# Patient Record
Sex: Female | Born: 1970 | Race: Black or African American | Hispanic: No | Marital: Single | Smoking: Never smoker
Health system: Southern US, Community
[De-identification: ages and names within clinical notes are randomized; demographics above are authoritative.]

---

## 2000-01-26 ENCOUNTER — Encounter: Payer: Self-pay | Admitting: *Deleted

## 2000-01-26 ENCOUNTER — Encounter (INDEPENDENT_AMBULATORY_CARE_PROVIDER_SITE_OTHER): Payer: Self-pay

## 2000-01-26 ENCOUNTER — Encounter (HOSPITAL_COMMUNITY): Admission: RE | Admit: 2000-01-26 | Discharge: 2000-04-20 | Payer: Self-pay | Admitting: *Deleted

## 2000-02-03 ENCOUNTER — Encounter: Admission: RE | Admit: 2000-02-03 | Discharge: 2000-02-03 | Payer: Self-pay | Admitting: Obstetrics & Gynecology

## 2000-02-11 ENCOUNTER — Encounter: Admission: RE | Admit: 2000-02-11 | Discharge: 2000-02-11 | Payer: Self-pay | Admitting: Obstetrics

## 2000-02-25 ENCOUNTER — Encounter: Admission: RE | Admit: 2000-02-25 | Discharge: 2000-02-25 | Payer: Self-pay | Admitting: Obstetrics

## 2000-03-24 ENCOUNTER — Encounter: Admission: RE | Admit: 2000-03-24 | Discharge: 2000-03-24 | Payer: Self-pay | Admitting: Obstetrics

## 2000-03-28 ENCOUNTER — Inpatient Hospital Stay (HOSPITAL_COMMUNITY): Admission: RE | Admit: 2000-03-28 | Discharge: 2000-03-28 | Payer: Self-pay | Admitting: Obstetrics

## 2000-03-31 ENCOUNTER — Encounter: Admission: RE | Admit: 2000-03-31 | Discharge: 2000-03-31 | Payer: Self-pay | Admitting: Obstetrics

## 2000-04-07 ENCOUNTER — Encounter: Admission: RE | Admit: 2000-04-07 | Discharge: 2000-04-07 | Payer: Self-pay | Admitting: Obstetrics

## 2000-04-14 ENCOUNTER — Encounter: Admission: RE | Admit: 2000-04-14 | Discharge: 2000-04-14 | Payer: Self-pay | Admitting: Obstetrics

## 2000-04-18 ENCOUNTER — Inpatient Hospital Stay (HOSPITAL_COMMUNITY): Admission: AD | Admit: 2000-04-18 | Discharge: 2000-04-22 | Payer: Self-pay | Admitting: Obstetrics & Gynecology

## 2001-09-28 ENCOUNTER — Other Ambulatory Visit: Admission: RE | Admit: 2001-09-28 | Discharge: 2001-09-28 | Payer: Self-pay | Admitting: Obstetrics

## 2005-08-26 ENCOUNTER — Ambulatory Visit: Payer: Self-pay | Admitting: Neonatology

## 2005-08-26 ENCOUNTER — Inpatient Hospital Stay (HOSPITAL_COMMUNITY): Admission: AD | Admit: 2005-08-26 | Discharge: 2005-08-31 | Payer: Self-pay | Admitting: Obstetrics

## 2005-10-06 ENCOUNTER — Inpatient Hospital Stay (HOSPITAL_COMMUNITY): Admission: AD | Admit: 2005-10-06 | Discharge: 2005-10-06 | Payer: Self-pay | Admitting: Obstetrics

## 2005-10-20 ENCOUNTER — Inpatient Hospital Stay (HOSPITAL_COMMUNITY): Admission: RE | Admit: 2005-10-20 | Discharge: 2005-10-22 | Payer: Self-pay | Admitting: Obstetrics

## 2005-10-20 ENCOUNTER — Ambulatory Visit: Payer: Self-pay | Admitting: Infectious Diseases

## 2005-12-10 ENCOUNTER — Ambulatory Visit (HOSPITAL_COMMUNITY): Admission: RE | Admit: 2005-12-10 | Discharge: 2005-12-10 | Payer: Self-pay | Admitting: Obstetrics & Gynecology

## 2005-12-10 ENCOUNTER — Encounter (INDEPENDENT_AMBULATORY_CARE_PROVIDER_SITE_OTHER): Payer: Self-pay | Admitting: *Deleted

## 2005-12-10 ENCOUNTER — Encounter (INDEPENDENT_AMBULATORY_CARE_PROVIDER_SITE_OTHER): Payer: Self-pay | Admitting: Specialist

## 2005-12-12 ENCOUNTER — Inpatient Hospital Stay (HOSPITAL_COMMUNITY): Admission: AD | Admit: 2005-12-12 | Discharge: 2005-12-12 | Payer: Self-pay | Admitting: Obstetrics

## 2005-12-19 ENCOUNTER — Emergency Department (HOSPITAL_COMMUNITY): Admission: EM | Admit: 2005-12-19 | Discharge: 2005-12-19 | Payer: Self-pay | Admitting: Emergency Medicine

## 2007-12-16 ENCOUNTER — Emergency Department (HOSPITAL_COMMUNITY): Admission: EM | Admit: 2007-12-16 | Discharge: 2007-12-17 | Payer: Self-pay | Admitting: Emergency Medicine

## 2010-04-05 ENCOUNTER — Encounter: Payer: Self-pay | Admitting: Obstetrics

## 2010-07-31 NOTE — Discharge Summary (Signed)
Teresa Bernard, FISKE            ACCOUNT NO.:  [id number]   MEDICAL RECORD NO.:  [id number]          PATIENT TYPE:  INP   LOCATION:  9154                          [facility]   PHYSICIAN:  Roseanna Rainbow, M.D.DATE OF BIRTH:  January 31, 1971   DATE OF ADMISSION:  08/26/2005  DATE OF DISCHARGE:  08/31/2005                                 DISCHARGE SUMMARY   CHIEF COMPLAINT:  The patient is a 40 year old gravida 3, para 1 with an  estimated date of confinement August 10 with an intrauterine pregnancy at  31+ weeks complaining of decreased fetal movement.  Please see the dictated  history and physical for further details.   HOSPITAL COURSE:  The patient was admitted. The fetal heart rate was  monitored continuously.  She was initially given magnesium sulfate for  tocolysis.  She also received a course of steroids to stimulate fetal lung  maturity.  Serial BPPs were reassuring.  She was discharged to home on  June19.  She was counseled to perform b.i.d. kick counts, continue modified  bed rest, and twice weekly testing.   DISCHARGE DIAGNOSIS:  Intrauterine pregnancy at 32 weeks, suspicious fetal  heart tracing.   CONDITION:  Good.   DIET:  Regular.   ACTIVITY:  Modified bed rest   MEDICATIONS:  Resume home medications.   DISPOSITION:  The patient was to follow up at the [facility]  on June22 at 12:30 she was to follow at the [facility] on [address]  at 2:20.      Roseanna Rainbow, M.D.  Electronically Signed     LAJ/MEDQ  D:  09/25/2005  T:  09/25/2005  Job:  [id number]

## 2010-07-31 NOTE — Op Note (Signed)
Teresa Bernard, Teresa Bernard            ACCOUNT NO.:  [id number]   MEDICAL RECORD NO.:  [id number]          PATIENT TYPE:  AMB   LOCATION:  SDC                           [facility]   PHYSICIAN:  Roseanna Rainbow, M.D.DATE OF BIRTH:  November 26, 1970   DATE OF PROCEDURE:  12/10/2005  DATE OF DISCHARGE:  12/10/2005                                 OPERATIVE REPORT   PREOPERATIVE DIAGNOSIS:  Multipara, desires sterilization procedure.   POSTOPERATIVE DIAGNOSIS:  Multipara, desires sterilization procedure.  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   PROCEDURE:  Laparoscopic bilateral tubal ligation with fulguration and left  ovarian cystectomy.   SURGEON:  Dr. Tamela Oddi and Dr. Clearance Coots.   ANESTHESIA:  General endotracheal.   COMPLICATIONS:  None.   ESTIMATED BLOOD LOSS:  Minimal.   PATHOLOGY:  Left ovarian cyst.   PROCEDURE:  The patient was taken to the operating room.  General anesthesia  was administered without difficulty.  She was then placed in the dorsal  lithotomy position and prepped and draped in the usual sterile fashion.  A  bivalve speculum was placed in the patient's vagina.  The anterior lip of  the cervix was grasped with a single-tooth tenaculum.  A Hulka manipulator  was then advanced into the uterus and secured to the anterior lip of the  cervix.  The single-tooth tenaculum was then removed.  Attention was then  turned to the abdomen where an infraumbilical skin incision was made in the  umbilical fold.  The Veress needle was introduced into the abdomen while  tenting up the anterior abdominal wall to 45 degrees angle.  Intra-abdominal  placement was confirmed by a saline drop test as well as an appropriate low  pressure reading upon insufflation of the abdomen with CO2 gas.  The abdomen  was insufflated with 3.5 liters of CO2 gas.  The trocar and sleeve were then  advanced into the abdomen where intra-abdominal placement was confirmed by  the  laparoscope.  Survey of the pelvic anatomy demonstrated both the simple  appearing cyst emanating from the left ovary as well as a pedunculated  dermoid cyst arising from the ovary.  This cystic area was approximately 3  cm in diameter.  At this point a supraumbilical 10 mm trocar and sleeve were  advanced to the abdomen as well as a 5 mm left lower quadrant trocar and  sleeve.  Peritoneal washings were obtained.  The mid isthmic portion of the  fallopian tubes were cauterized using the Kleppinger.  A 2 cm segment of  tube was cauterized contiguously bilaterally.  With each application the  ohmmeter was noted to go to zero.  The stalk of the ovarian cyst on the left  tube was then cauterized with the Kleppinger.  This area was then severed  with Endoshears.  During this process the cyst wall was inadvertently  ruptured and there was some leakage of sebaceous fluid.  The cyst was then  placed in the EndoCatch.  The EndoCatch was then brought to the anterior  abdominal wall and removal of the cyst was facilitated by morcellating the  cyst.  The fascial incisions for the suprapubic and infraumbilical ports  were then reapproximated with 0 Vicryl.  The skin was reapproximated with  interrupted sutures of 3-0 Vicryl and Dermabond.  Please note that the  Nezhat irrigator was used to copiously irrigate the pelvis and abdomen.  The  operative sites were felt to be hemostatic.  The Hulka manipulator was  removed from the cervix with minimal bleeding noted.  At the close of the  procedure the instrument and pack counts were said to be correct x2.  The  patient was awakened from general anesthesia and taken to the PACU in stable  condition.      Roseanna Rainbow, M.D.  Electronically Signed     LAJ/MEDQ  D:  12/10/2005  T:  12/12/2005  Job:  [id number]

## 2010-07-31 NOTE — H&P (Signed)
Teresa Bernard, PAULSON            ACCOUNT NO.:  [id number]   MEDICAL RECORD NO.:  [id number]          PATIENT TYPE:  AMB   LOCATION:  SDC                           [facility]   PHYSICIAN:  Roseanna Rainbow, M.D.DATE OF BIRTH:  01-25-1971   DATE OF ADMISSION:  DATE OF DISCHARGE:                                HISTORY & PHYSICAL   CHIEF COMPLAINT:  The patient is a 40 year old para 2 who desires a  sterilization procedure.   HISTORY OF PRESENT ILLNESS:  Please see the above.   ALLERGIES:  No known drug allergies.   MEDICATIONS:  Kaletra and Combivir.   PAST GYN HISTORY:  She has a history of abnormal Pap smears.  She has a  history of genital herpes.   PAST MEDICAL HISTORY:  She is HIV positive.   PAST SURGICAL HISTORY:  No previous surgery.   PAST OBSTETRICAL HISTORY:  She has a history of two spontaneous vaginal  deliveries.   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  She is single.  She does not give any significant history of alcohol use.  She has no  significant smoking history.  She denies illicit drug use.   FAMILY HISTORY:  Hypertension.   PHYSICAL EXAMINATION:  VITAL SIGNS:  Temperature 98.8, pulse 80, blood pressure 117/89, weight 114  pounds.  GENERAL: Thin woman in no apparent distress.  LUNGS:  Clear to auscultation bilaterally.  HEART:  Regular rate and rhythm.  ABDOMEN: Soft, nontender, no organomegaly.  PELVIC:  Normal EG BUS, speculum exam reveals the vagina is clean.  Bimanual  exam, the uterus is small, anteverted, nontender.  The adnexa are not  palpable and nontender.  EXTREMITIES:  No cyanosis, clubbing, and edema.  SKIN:  Without rash.   ASSESSMENT:  Multiparous who desires a sterilization procedure.   PLAN:  The planned procedure is a laparoscopic bilateral tubal ligation.  The risks, benefits, and alternative forms of management were reviewed with  the patient including but not limited to a 1% failure rate with a  subsequent  increase of an ectopic pregnancy.      Roseanna Rainbow, M.D.  Electronically Signed     LAJ/MEDQ  D:  12/09/2005  T:  12/09/2005  Job:  [id number]

## 2010-07-31 NOTE — H&P (Signed)
Teresa Bernard, Teresa Bernard            ACCOUNT NO.:  [id number]   MEDICAL RECORD NO.:  [id number]          PATIENT TYPE:  INP   LOCATION:  9154                          [facility]   PHYSICIAN:  Roseanna Rainbow, M.D.DATE OF BIRTH:  January 14, 1971   DATE OF ADMISSION:  08/26/2005  DATE OF DISCHARGE:                                HISTORY & PHYSICAL   CHIEF COMPLAINT:  The patient is a 40 year old gravida 3, para 1, with an  estimated date of confinement of October 22, 2005, with an intrauterine  pregnancy at 31 plus weeks complaining of decreased fetal movement.   HISTORY OF PRESENT ILLNESS:  Please see the above.  She denies any other  complaints.   ALLERGIES:  No known drug allergies.   MEDICATIONS:  Valtrex, prenatal vitamins, Kaletra, and Combivir.   RISK FACTORS:  HIV positive, polyhydramnios, advanced maternal age, anemia.   LABORATORY DATA:  Hemoglobin 9.8, hematocrit 30, platelets 254,000.  One  hour GCT 101.  Urine culture and sensitivity with insignificant growth.  Hepatitis B surface antigen negative.  Blood type is B positive, antibody  screen negative.  Rubella is non-immune.  RPR nonreactive.  Sickle cell  negative.  An ultrasound on April 20 at 24 weeks, the estimated fetal weight  percentile was 39th percentile, polyhydramnios was noted with an AFI of  27.2, the fetal stomach was normal.  On May 14, ultrasound was repeated at  27 weeks 3 weeks, the AFI was 21.5, estimated fetal weight percentile was  57th percentile, and this was felt to be high normal amniotic fluid and  anatomy was normal.   PAST GYN HISTORY:  She has a history of abnormal Pap smears.   PAST MEDICAL HISTORY:  Please see the above.   PAST SURGICAL HISTORY:  No previous surgery.   PAST OBSTETRICAL HISTORY:  She has a history of spontaneous vaginal delivery  at 36 weeks.  Per patient report, labor was induced for oligohydramnios.   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  She is  single.  She does  not give any significant history of alcohol use.  She has no significant  smoking history.  She denies illicit drug use.   FAMILY HISTORY:  Hypertension.   PHYSICAL EXAMINATION:  VITAL SIGNS:  Heart rate 78, respiratory rate 18, blood pressure 96/73,  fetal heart tracing baseline 160s to 170s, average long term variability,  occasional variable decelerations a with contraction.  ABDOMEN:  Gravid, nontender.  PELVIC:  Sterile vaginal exam per the RN.   ASSESSMENT:  Intrauterine pregnancy at 31 plus weeks with irregular  contractions, fetal heart tracing with mild sinus tachycardia and mild  variable decelerations.   PLAN:  Admission, magnesium sulfate tocolysis, steroids to stimulate fetal  lung maturity, monitor the fetal heart tracing closely, and will also obtain  a biophysical profile.      Roseanna Rainbow, M.D.  Electronically Signed     LAJ/MEDQ  D:  08/26/2005  T:  08/26/2005  Job:  [id number]

## 2011-01-14 ENCOUNTER — Other Ambulatory Visit (HOSPITAL_COMMUNITY): Payer: Self-pay | Admitting: *Deleted

## 2011-01-14 ENCOUNTER — Other Ambulatory Visit: Payer: Self-pay

## 2011-01-14 DIAGNOSIS — Z1231 Encounter for screening mammogram for malignant neoplasm of breast: Secondary | ICD-10-CM

## 2011-02-10 ENCOUNTER — Ambulatory Visit (HOSPITAL_COMMUNITY)
Admission: RE | Admit: 2011-02-10 | Discharge: 2011-02-10 | Disposition: A | Discharge status: Home / Self Care | Payer: Self-pay | Source: Ambulatory Visit | Attending: Internal Medicine | Admitting: Internal Medicine

## 2011-02-10 DIAGNOSIS — Z1231 Encounter for screening mammogram for malignant neoplasm of breast: Secondary | ICD-10-CM

## 2011-02-11 ENCOUNTER — Other Ambulatory Visit: Payer: Self-pay | Admitting: Internal Medicine

## 2011-02-11 ENCOUNTER — Other Ambulatory Visit: Payer: Self-pay | Admitting: *Deleted

## 2011-02-11 DIAGNOSIS — R928 Other abnormal and inconclusive findings on diagnostic imaging of breast: Secondary | ICD-10-CM

## 2011-02-25 ENCOUNTER — Ambulatory Visit
Admission: RE | Admit: 2011-02-25 | Discharge: 2011-02-25 | Disposition: A | Discharge status: Home / Self Care | Payer: Self-pay | Source: Ambulatory Visit | Attending: *Deleted | Admitting: *Deleted

## 2011-02-25 DIAGNOSIS — R928 Other abnormal and inconclusive findings on diagnostic imaging of breast: Secondary | ICD-10-CM

## 2013-03-27 ENCOUNTER — Ambulatory Visit (HOSPITAL_COMMUNITY)
Admission: RE | Admit: 2013-03-27 | Discharge: 2013-03-27 | Disposition: A | Discharge status: Home / Self Care | Payer: Self-pay | Attending: Psychiatry | Admitting: Psychiatry

## 2013-03-27 ENCOUNTER — Encounter (HOSPITAL_COMMUNITY): Payer: Self-pay | Admitting: *Deleted

## 2013-03-27 DIAGNOSIS — F329 Major depressive disorder, single episode, unspecified: Secondary | ICD-10-CM | POA: Insufficient documentation

## 2013-03-27 DIAGNOSIS — F3289 Other specified depressive episodes: Secondary | ICD-10-CM | POA: Insufficient documentation
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
## 2013-07-12 ENCOUNTER — Encounter (HOSPITAL_COMMUNITY): Payer: Self-pay | Admitting: Emergency Medicine

## 2013-07-12 ENCOUNTER — Emergency Department (HOSPITAL_COMMUNITY)
Admission: EM | Admit: 2013-07-12 | Discharge: 2013-07-12 | Disposition: A | Discharge status: Home / Self Care | Payer: No Typology Code available for payment source | Attending: Emergency Medicine | Admitting: Emergency Medicine

## 2013-07-12 DIAGNOSIS — Y92410 Unspecified street and highway as the place of occurrence of the external cause: Secondary | ICD-10-CM | POA: Insufficient documentation

## 2013-07-12 DIAGNOSIS — Y9389 Activity, other specified: Secondary | ICD-10-CM | POA: Insufficient documentation

## 2013-07-12 DIAGNOSIS — Z880 Allergy status to penicillin: Secondary | ICD-10-CM | POA: Insufficient documentation

## 2013-07-12 DIAGNOSIS — M545 Low back pain, unspecified: Secondary | ICD-10-CM | POA: Insufficient documentation

## 2013-07-12 MED ORDER — IBUPROFEN 400 MG PO TABS
400.0000 mg | ORAL_TABLET | Freq: Once | ORAL | Status: DC
  Filled 2013-07-12: qty 1

## 2013-07-12 NOTE — Discharge Instructions (Signed)
You may take tylenol and ibuprofen as needed for pain.  You may also alternate ice and heat as needed for muscle pain.  See below for further instruction.
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
## 2013-07-12 NOTE — ED Provider Notes (Signed)
Medical screening examination/treatment/procedure(s) were performed by non-physician practitioner and as supervising physician I was immediately available for consultation/collaboration.   EKG Interpretation None       Glynn OctaveStephen Breccan Galant, MD 07/12/13 1726
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
## 2014-05-17 ENCOUNTER — Emergency Department (HOSPITAL_COMMUNITY)
Admission: EM | Admit: 2014-05-17 | Discharge: 2014-05-17 | Disposition: A | Discharge status: Home / Self Care | Payer: Self-pay | Attending: Emergency Medicine | Admitting: Emergency Medicine

## 2014-05-17 ENCOUNTER — Encounter (HOSPITAL_COMMUNITY): Payer: Self-pay

## 2014-05-17 DIAGNOSIS — W01198A Fall on same level from slipping, tripping and stumbling with subsequent striking against other object, initial encounter: Secondary | ICD-10-CM | POA: Insufficient documentation

## 2014-05-17 DIAGNOSIS — Y998 Other external cause status: Secondary | ICD-10-CM | POA: Insufficient documentation

## 2014-05-17 DIAGNOSIS — S199XXA Unspecified injury of neck, initial encounter: Secondary | ICD-10-CM | POA: Insufficient documentation

## 2014-05-17 DIAGNOSIS — S0990XA Unspecified injury of head, initial encounter: Secondary | ICD-10-CM | POA: Insufficient documentation

## 2014-05-17 DIAGNOSIS — Y92002 Bathroom of unspecified non-institutional (private) residence single-family (private) house as the place of occurrence of the external cause: Secondary | ICD-10-CM | POA: Insufficient documentation

## 2014-05-17 DIAGNOSIS — Z880 Allergy status to penicillin: Secondary | ICD-10-CM | POA: Insufficient documentation

## 2014-05-17 DIAGNOSIS — W19XXXA Unspecified fall, initial encounter: Secondary | ICD-10-CM

## 2014-05-17 DIAGNOSIS — Y9389 Activity, other specified: Secondary | ICD-10-CM | POA: Insufficient documentation

## 2014-05-17 NOTE — Discharge Instructions (Signed)

## 2014-05-17 NOTE — ED Notes (Signed)
Pt sts that she tripped yesterday hitting her Left forehead on tub. Pt denies LOC and reports tenderness to forehead and neck. Pt is A&O x4 and in NAD

## 2014-05-17 NOTE — ED Provider Notes (Signed)
CSN: [id number]     Arrival date & time 05/17/14  5784 History   First MD Initiated Contact with Patient 05/17/14 [phone]     Chief Complaint  Patient presents with  . Fall  . Head Injury     (Consider location/radiation/quality/duration/timing/severity/associated sxs/prior Treatment) Patient is a 44 y.o. female presenting with fall.  Fall This is a new problem. Episode onset: yesterday. Episode frequency: once. Associated symptoms include headaches (yesterday, now resolved). Pertinent negatives include no chest pain, no abdominal pain and no shortness of breath. Nothing aggravates the symptoms. Nothing relieves the symptoms.    History reviewed. No pertinent past medical history. History reviewed. No pertinent past surgical history. Family History  Problem Relation Age of Onset  . Hypertension Father   . Diabetes Father    History  Substance Use Topics  . Smoking status: Never Smoker   . Smokeless tobacco: Never Used  . Alcohol Use: No   OB History    No data available     Review of Systems  Respiratory: Negative for shortness of breath.   Cardiovascular: Negative for chest pain.  Gastrointestinal: Negative for abdominal pain.  Neurological: Positive for headaches (yesterday, now resolved).  All other systems reviewed and are negative.     Allergies  Amoxicillin  Home Medications   Prior to Admission medications   Not on File   BP 127/74 mmHg  Pulse 65  Temp(Src) 97.8 F (36.6 C) (Oral)  Resp 16  SpO2 98% Physical Exam  Constitutional: She is oriented to person, place, and time. She appears well-developed and well-nourished.  HENT:  Head: Normocephalic and atraumatic.  Right Ear: External ear normal.  Left Ear: External ear normal.  Eyes: Conjunctivae and EOM are normal. Pupils are equal, round, and reactive to light.  Neck: Normal range of motion. Neck supple.  Cardiovascular: Normal rate, regular rhythm, normal heart sounds and intact distal pulses.    Pulmonary/Chest: Effort normal and breath sounds normal.  Abdominal: Soft. Bowel sounds are normal. There is no tenderness.  Musculoskeletal: Normal range of motion.  Neurological: She is alert and oriented to person, place, and time. She has normal strength and normal reflexes. No cranial nerve deficit or sensory deficit. Coordination and gait normal. GCS eye subscore is 4. GCS verbal subscore is 5. GCS motor subscore is 6.  Skin: Skin is warm and dry.  Vitals reviewed.   ED Course  Procedures (including critical care time) Labs Review Labs Reviewed - No data to display  Imaging Review No results found.   EKG Interpretation None      MDM   Final diagnoses:  Fall, initial encounter    44 y.o. female without pertinent PMH presents with left-sided forehead tenderness and neck stiffness after falling and hitting her head on the bathtub yesterday. This morning she awoke and had blurring of her vision which she described as only blurring of lights, this lasted for a very short period of time and resolved.  No ha currently.  Exam benign, no ataxia, no focal neuro deficits.  No spinal tenderness.  Discussed utility of imaging of head (do not feel neck imaging warranted given FROM, no neck tenderness) and with shared decision making agreed to forego given no LOC, no neuro deficits, and transient very mild symptoms this AM.  We specifically discussed return precautions and possibilty of underlying SDH or EDH, pt agreed to return and continued to request to leave without head CT.  DC home in good condition.  I have  reviewed all laboratory and imaging studies if ordered as above  1. Fall, initial encounter         Mirian MoMatthew Gentry, MD 05/17/14 1032

## 2014-05-17 NOTE — ED Notes (Signed)
Patient reports that she tripped and fell hitting her head on the tub. Patient states she hit the left portion of her forehead. Today, patient is having blurred vision, tenderness left forehead, and stiffness left lateral neck.

## 2016-02-16 ENCOUNTER — Emergency Department (HOSPITAL_COMMUNITY): Payer: Self-pay

## 2016-02-16 ENCOUNTER — Encounter (HOSPITAL_COMMUNITY): Payer: Self-pay | Admitting: *Deleted

## 2016-02-16 ENCOUNTER — Emergency Department (HOSPITAL_COMMUNITY)
Admission: EM | Admit: 2016-02-16 | Discharge: 2016-02-16 | Disposition: A | Discharge status: Home / Self Care | Payer: Self-pay | Attending: Emergency Medicine | Admitting: Emergency Medicine

## 2016-02-16 DIAGNOSIS — Y929 Unspecified place or not applicable: Secondary | ICD-10-CM | POA: Insufficient documentation

## 2016-02-16 DIAGNOSIS — Y999 Unspecified external cause status: Secondary | ICD-10-CM | POA: Insufficient documentation

## 2016-02-16 DIAGNOSIS — W010XXA Fall on same level from slipping, tripping and stumbling without subsequent striking against object, initial encounter: Secondary | ICD-10-CM | POA: Insufficient documentation

## 2016-02-16 DIAGNOSIS — M25532 Pain in left wrist: Secondary | ICD-10-CM | POA: Insufficient documentation

## 2016-02-16 DIAGNOSIS — Y939 Activity, unspecified: Secondary | ICD-10-CM | POA: Insufficient documentation

## 2016-02-16 NOTE — Discharge Instructions (Signed)
Please take over the counter ibuprofen or naproxen as needed for pain and swelling. Please ice area for 15 minutes 3 times a day.   Get help right away if: You lose feeling in your fingers or hand. Your fingers turn white, very red, or cold and blue. You cannot move your fingers. You have a fever or chills.

## 2016-02-16 NOTE — ED Provider Notes (Signed)
[facility]-EMERGENCY DEPT Provider Note   CSN: [id number] Arrival date & time: 02/16/16  2112     History   Chief Complaint Chief Complaint  Patient presents with  . Wrist Pain    HPI Teresa Bernard is a 45 y.o. female presents with left wrist pain since 8pm today. She states that she fell over her son's book bag and fell on her left wrist. She reports throbbing sensation localized to base of left thumb with severity of 10/10 pain. She states she has some ROM and full sensation to the area. She denies fever, chills, chest pain, SOB, nausea, vomiting, urinary symptoms, change in bowel symptoms, or headaches.   HPI  History reviewed. No pertinent past medical history.  There are no active problems to display for this patient.   History reviewed. No pertinent surgical history.  OB History    No data available       Home Medications    Prior to Admission medications   Not on File    Family History Family History  Problem Relation Age of Onset  . Hypertension Father   . Diabetes Father     Social History Social History  Substance Use Topics  . Smoking status: Never Smoker  . Smokeless tobacco: Never Used  . Alcohol use No     Allergies   Amoxicillin   Review of Systems Review of Systems  Constitutional: Negative for chills and fever.  Eyes: Negative for pain and visual disturbance.  Respiratory: Negative for cough and shortness of breath.   Cardiovascular: Negative for chest pain and palpitations.  Gastrointestinal: Negative for abdominal pain, constipation, diarrhea and vomiting.  Genitourinary: Negative for dysuria and hematuria.  Musculoskeletal: Positive for joint swelling (Left thumb). Negative for arthralgias and back pain.  Skin: Negative for color change and rash.  Neurological: Negative for seizures, syncope and headaches.     Physical Exam Updated Vital Signs BP 122/63   Pulse 74   Temp 98.3 F (36.8 C) (Oral)   Resp 16   SpO2 97%    Physical Exam  Constitutional: She is oriented to person, place, and time. She appears well-developed and well-nourished.  HENT:  Head: Normocephalic and atraumatic.  Nose: Nose normal.  Eyes: Conjunctivae and EOM are normal. Pupils are equal, round, and reactive to light.  Neck: Normal range of motion. Neck supple.  Cardiovascular: Normal rate and normal heart sounds.   Pulmonary/Chest: Effort normal and breath sounds normal. No respiratory distress.  Abdominal: Soft.  Musculoskeletal: She exhibits edema (Left thumb) and tenderness (Left thumb). She exhibits no deformity.       Left wrist: She exhibits decreased range of motion, tenderness and swelling. She exhibits no deformity and no laceration.  Neurological: She is alert and oriented to person, place, and time.  Skin: Skin is warm.  Psychiatric: She has a normal mood and affect. Her behavior is normal.  Nursing note and vitals reviewed.    ED Treatments / Results  Labs (all labs ordered are listed, but only abnormal results are displayed) Labs Reviewed - No data to display  EKG  EKG Interpretation None       Radiology Dg Wrist Complete Left  Result Date: 02/16/2016 CLINICAL DATA:  Left wrist pain after trip and fall. EXAM: LEFT WRIST - COMPLETE 3+ VIEW COMPARISON:  None. FINDINGS: There is no evidence of fracture or dislocation. There is no evidence of arthropathy or other focal bone abnormality. The scaphoid is intact. Soft tissues are unremarkable. IMPRESSION:  Negative radiographs of the left wrist. Electronically Signed   By: Rubye OaksMelanie  Ehinger M.D.   On: 02/16/2016 22:19    Procedures Procedures (including critical care time)  Medications Ordered in ED Medications - No data to display   Initial Impression / Assessment and Plan / ED Course  I have reviewed the triage vital signs and the nursing notes.  Pertinent labs & imaging results that were available during my care of the patient were reviewed by me and  considered in my medical decision making (see chart for details).  Clinical Course   Patient X-Ray negative for obvious fracture or dislocation. Pt advised to follow up with orthopedics if symptoms persist for possibility of missed fracture diagnosis.  Conservative therapy recommended and discussed. Patient will be dc home & is agreeable with above plan.  Final Clinical Impressions(s) / ED Diagnoses   Final diagnoses:  Left wrist pain    New Prescriptions There are no discharge medications for this patient.    [address]Garet Hooton [place] 02/16/16 2348    Tomasita CrumbleAdeleke Oni, MD 02/17/16 [phone]

## 2016-02-16 NOTE — ED Triage Notes (Signed)
Pt tipped over her son's book bag and braced herself with L wrist. Limited ROM, CMS intact

## 2018-06-25 IMAGING — DX DG WRIST COMPLETE 3+V*L*
4 series · 4 of 4 positions shown · non-contrast
Comparison: None.

CLINICAL DATA: Left wrist pain after trip and fall.

EXAM:
LEFT WRIST - COMPLETE 3+ VIEW

[wrist pa]
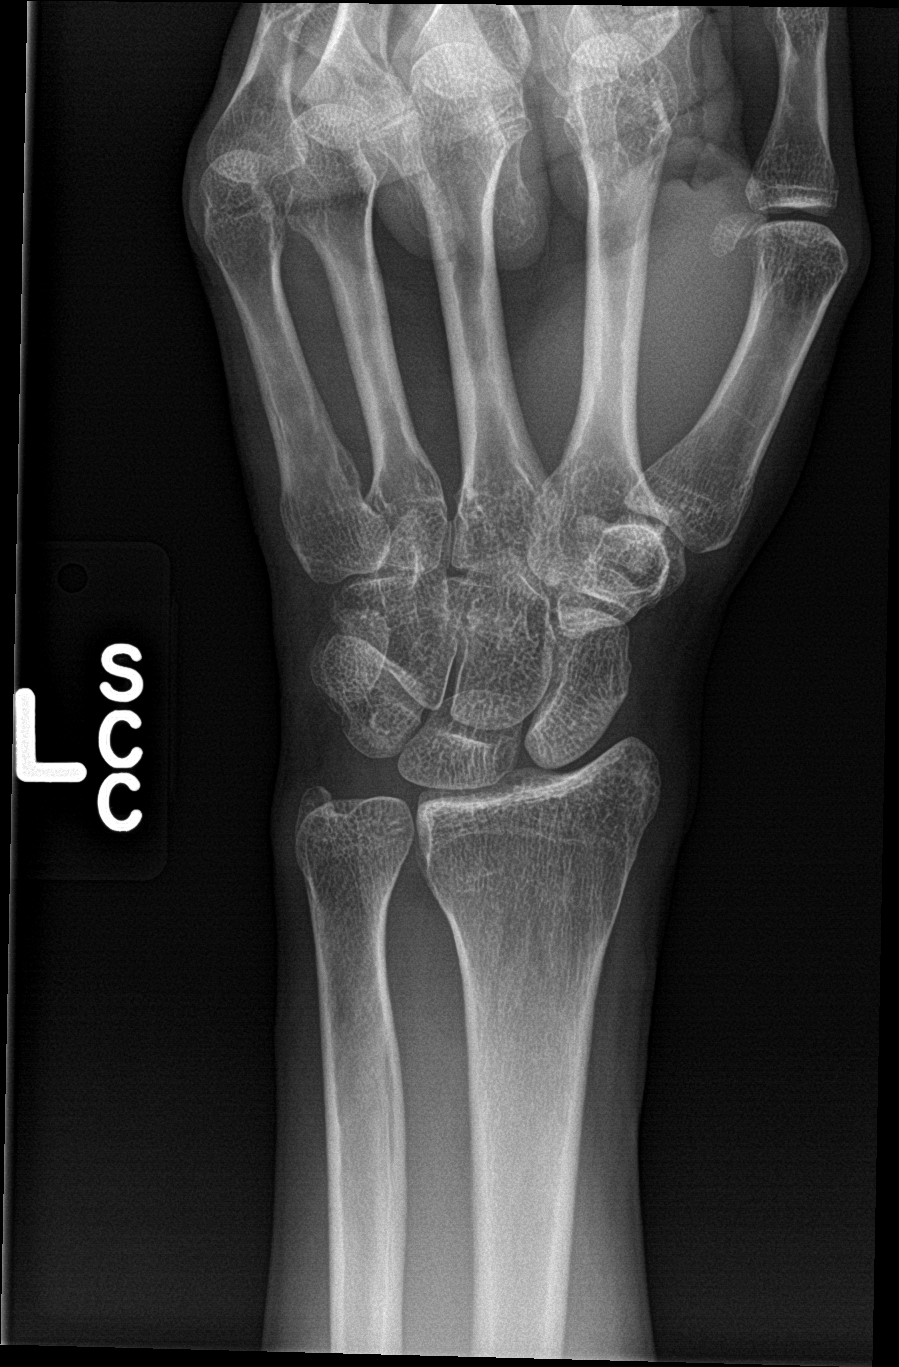

[wrist obl]
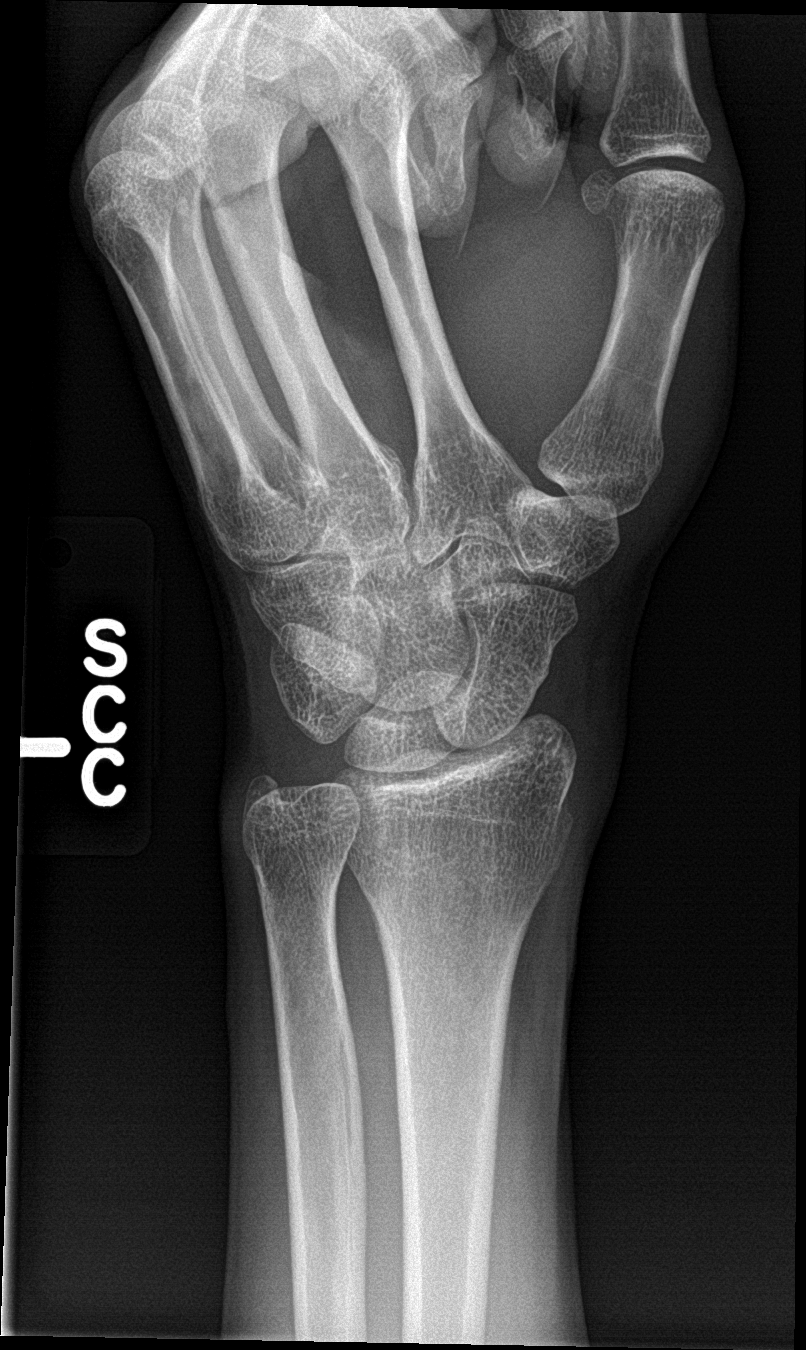

[wrist lat]
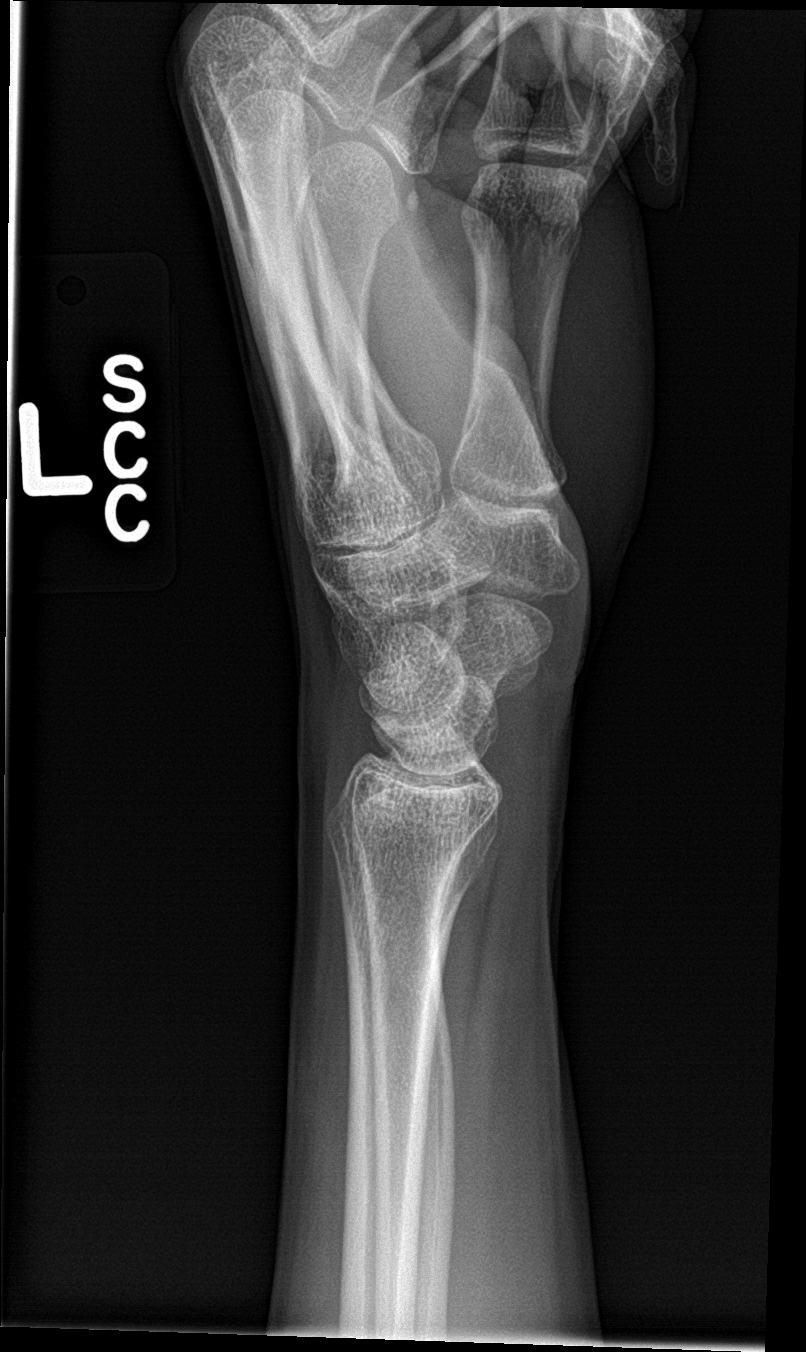

[wrist navicular]
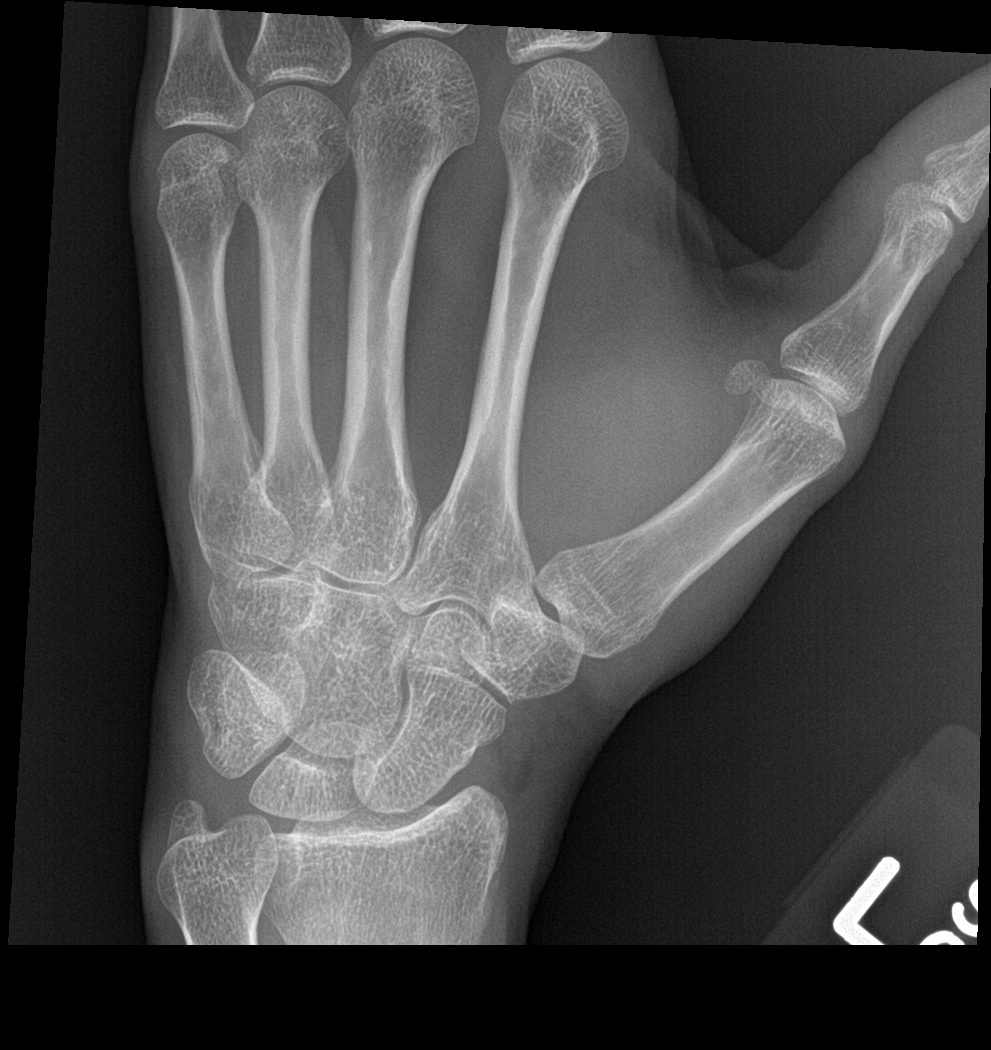

[4 of 4 positions shown; findings below may reference images not displayed]

FINDINGS: There is no evidence of fracture or dislocation. There is no
evidence of arthropathy or other focal bone abnormality. The
scaphoid is intact. Soft tissues are unremarkable.
IMPRESSION: Negative radiographs of the left wrist.
